# Patient Record
Sex: Female | Born: 1965 | Race: White | Hispanic: No | Marital: Married | Smoking: Never smoker
Health system: Southern US, Community
[De-identification: ages and names within clinical notes are randomized; demographics above are authoritative.]

---

## 1998-03-11 ENCOUNTER — Encounter: Admission: RE | Admit: 1998-03-11 | Discharge: 1998-06-09 | Payer: Self-pay | Admitting: Obstetrics and Gynecology

## 1998-05-19 ENCOUNTER — Inpatient Hospital Stay (HOSPITAL_COMMUNITY): Admission: AD | Admit: 1998-05-19 | Discharge: 1998-05-19 | Payer: Self-pay | Admitting: Obstetrics & Gynecology

## 1998-05-21 ENCOUNTER — Inpatient Hospital Stay (HOSPITAL_COMMUNITY): Admission: AD | Admit: 1998-05-21 | Discharge: 1998-05-24 | Payer: Self-pay | Admitting: Obstetrics and Gynecology

## 1998-05-24 ENCOUNTER — Encounter (HOSPITAL_COMMUNITY): Admission: RE | Admit: 1998-05-24 | Discharge: 1998-08-22 | Payer: Self-pay | Admitting: Obstetrics and Gynecology

## 1998-08-19 ENCOUNTER — Encounter (HOSPITAL_COMMUNITY): Admission: RE | Admit: 1998-08-19 | Discharge: 1998-11-17 | Payer: Self-pay | Admitting: *Deleted

## 2000-05-10 ENCOUNTER — Other Ambulatory Visit: Admission: RE | Admit: 2000-05-10 | Discharge: 2000-05-10 | Payer: Self-pay | Admitting: Obstetrics and Gynecology

## 2000-05-31 ENCOUNTER — Inpatient Hospital Stay (HOSPITAL_COMMUNITY): Admission: RE | Admit: 2000-05-31 | Discharge: 2000-06-02 | Payer: Self-pay | Admitting: Obstetrics and Gynecology

## 2003-05-03 ENCOUNTER — Encounter: Admission: RE | Admit: 2003-05-03 | Discharge: 2003-05-03 | Payer: Self-pay | Admitting: Allergy and Immunology

## 2003-07-30 ENCOUNTER — Other Ambulatory Visit: Admission: RE | Admit: 2003-07-30 | Discharge: 2003-07-30 | Payer: Self-pay | Admitting: Obstetrics and Gynecology

## 2003-10-22 ENCOUNTER — Encounter: Admission: RE | Admit: 2003-10-22 | Discharge: 2003-10-22 | Payer: Self-pay | Admitting: Obstetrics and Gynecology

## 2005-01-20 IMAGING — CT CT PARANASAL SINUSES LIMITED
1 series · 16 of 24 positions shown, 20 images · non-contrast
Comparison: none

CLINICAL DATA: Chronic cough, sinus drainage, nose bleeds, allergies.
 LIMITED PARANASAL SINUS CT ? NO CONTRAST ? 05/03/03

[Series 2: — · axial · 0.33mm/px · z∈[+0,+85]mm · 16 of 24 slices shown, 20 images]
[im 2/24  brain]
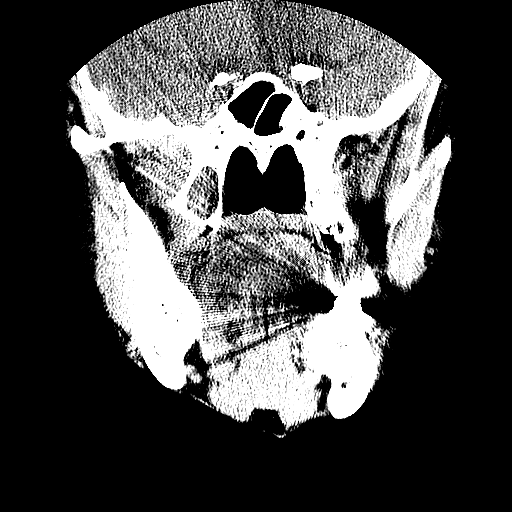
[im 2/24  bone]
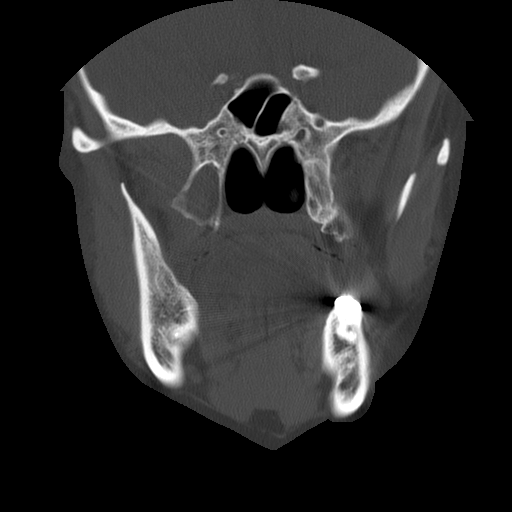
[im 4/24  bone]
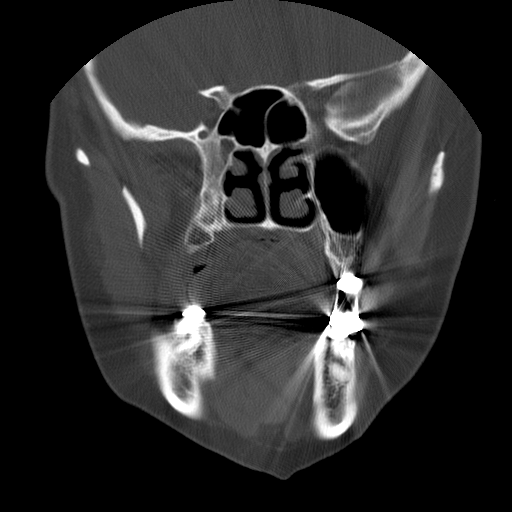
[im 5/24  bone]
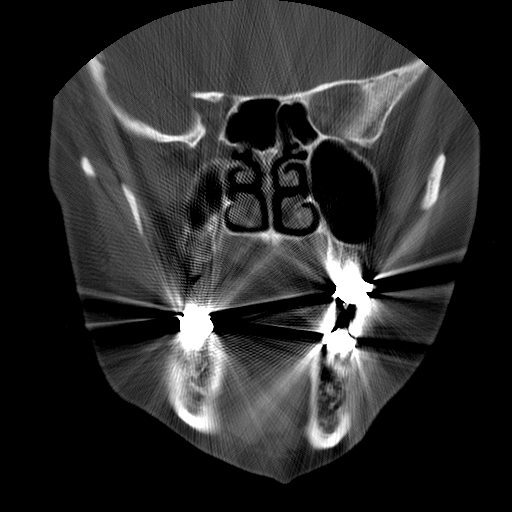
[im 6/24  bone]
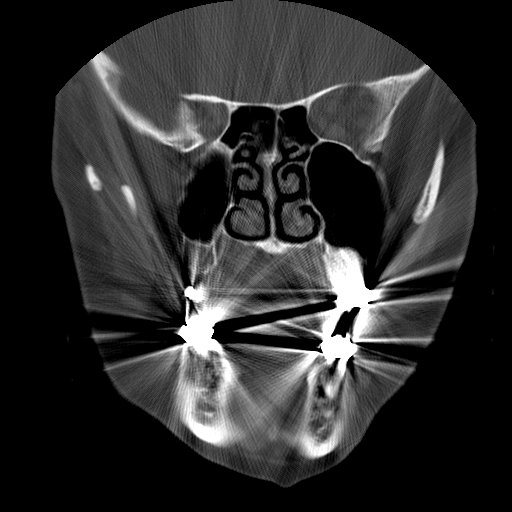
[im 8/24  brain]
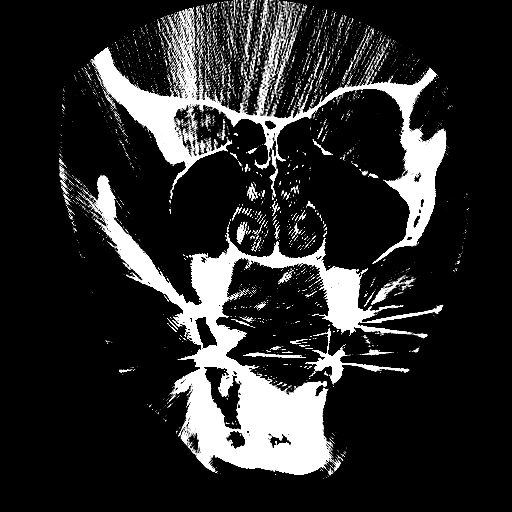
[im 8/24  bone]
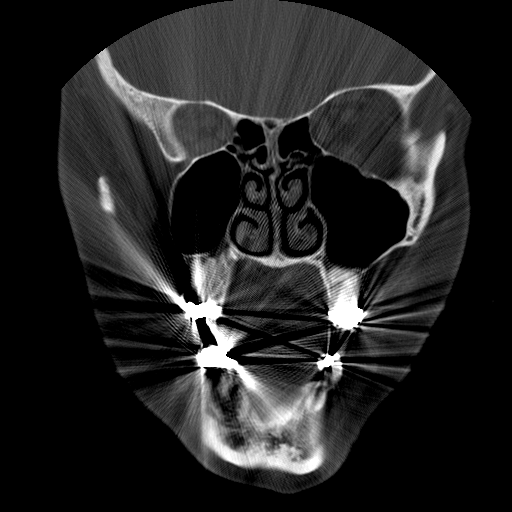
[im 9/24  bone]
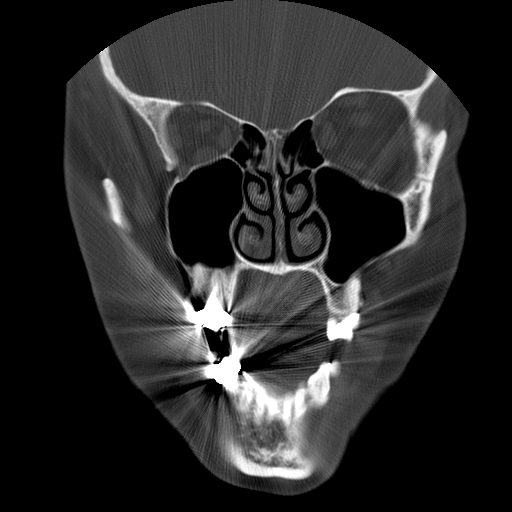
[im 10/24  bone]
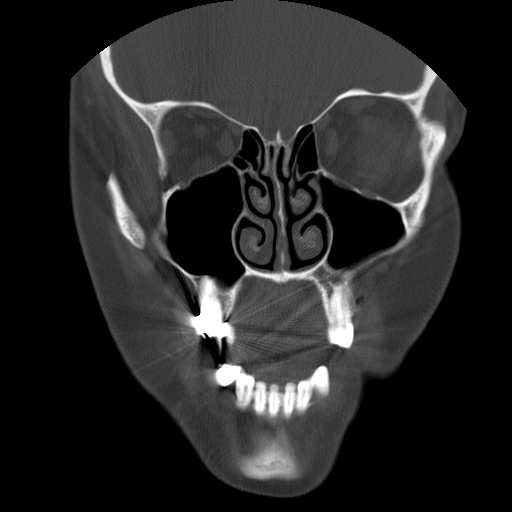
[im 12/24  bone]
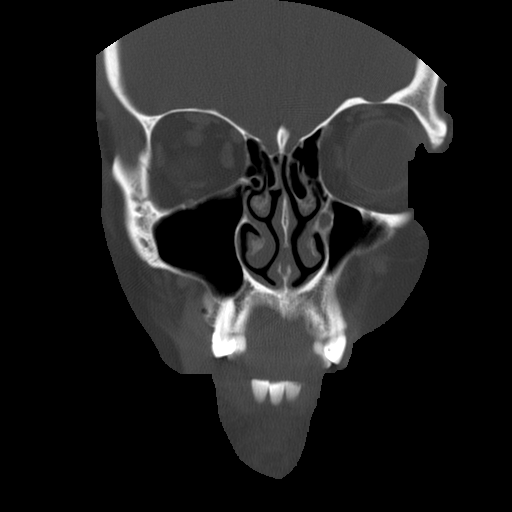
[im 13/24  brain]
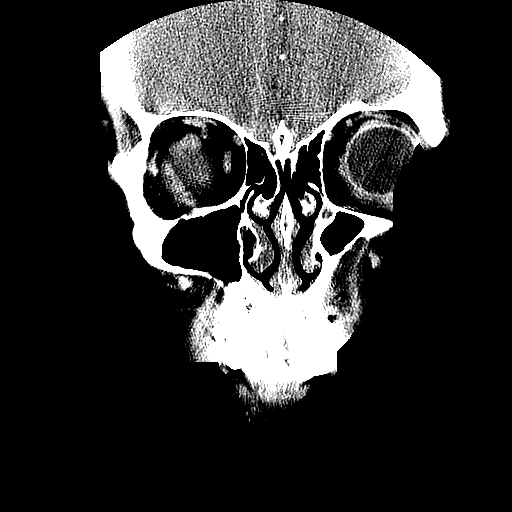
[im 13/24  bone]
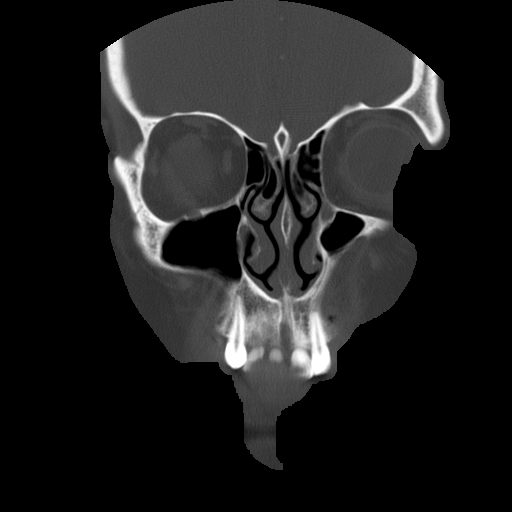
[im 15/24  bone]
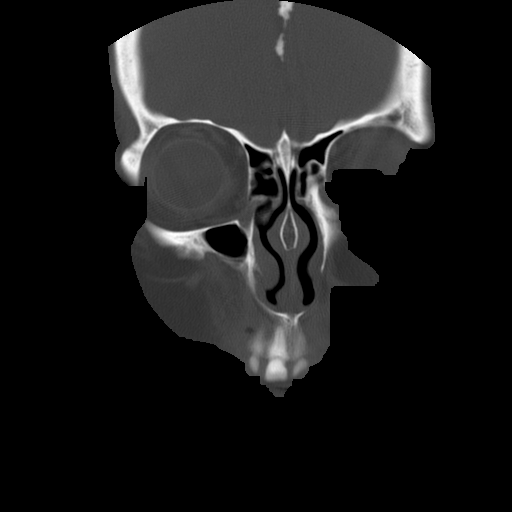
[im 16/24  bone]
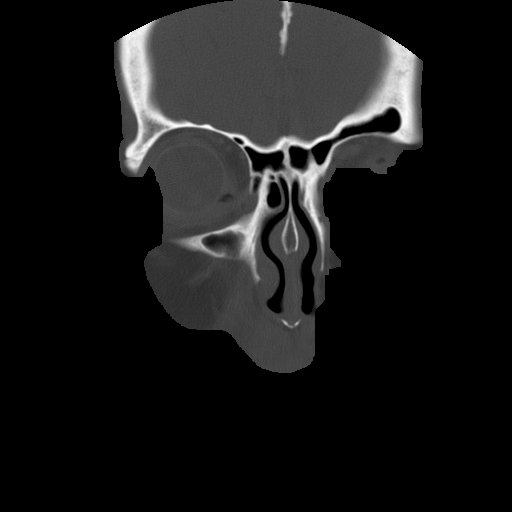
[im 17/24  bone]
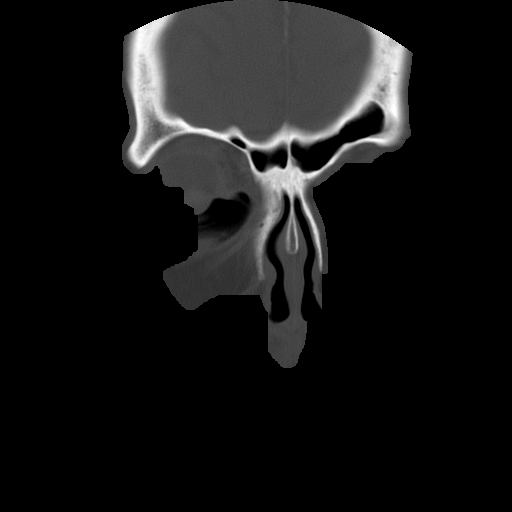
[im 19/24  brain]
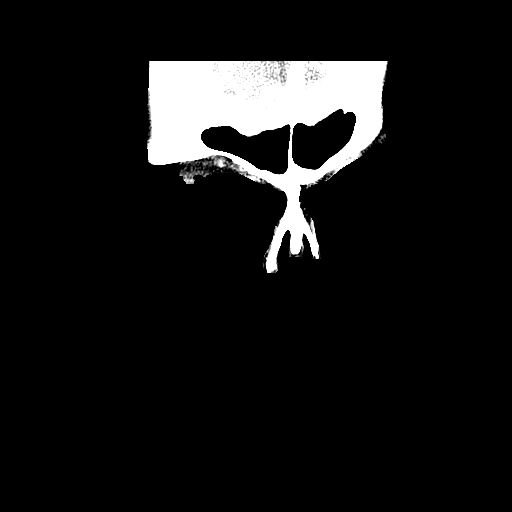
[im 19/24  bone]
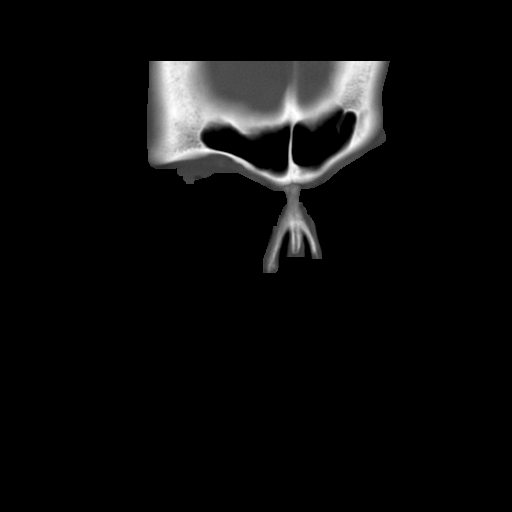
[im 20/24  bone]
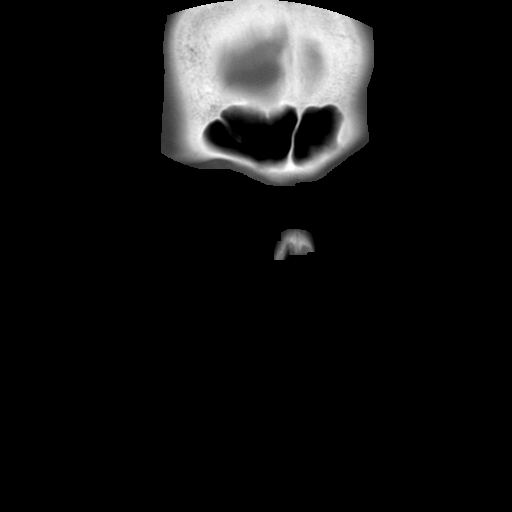
[im 21/24  bone]
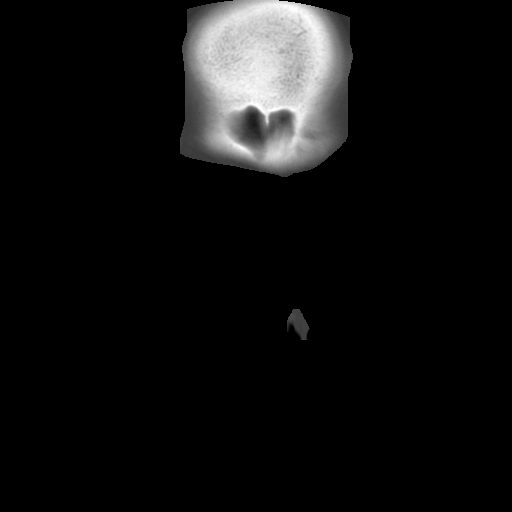
[im 23/24  bone]
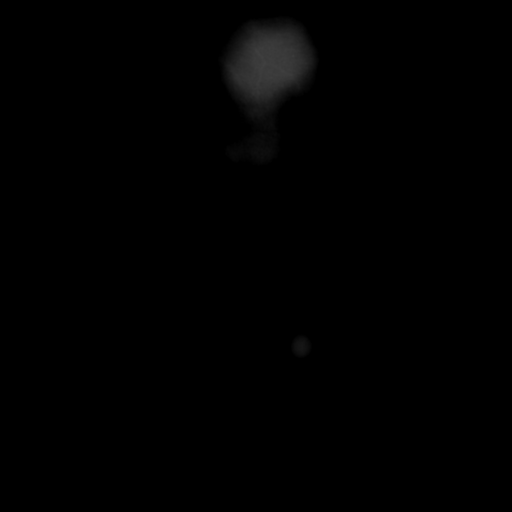

[16 of 24 positions shown; findings below may reference images not displayed]

FINDINGS: Selected coronal images were obtained through the major paranasal sinuses with no IV contrast.
 The paranasal sinuses are clear.  A small right concha bullosa, anatomic variant, is seen with no other significant abnormality noted.
 IMPRESSION
 1.  Small right concha bullosa.
 2.  Otherwise negative.
 CHEST -  2 VIEWS   - 05/03/03
FINDINGS: The heart size and mediastinal contours are normal.  The lungs are clear.  The visualized skeleton is unremarkable. 
 IMPRESSION
 No active disease.

## 2005-05-07 ENCOUNTER — Ambulatory Visit: Payer: Self-pay | Admitting: Family Medicine

## 2005-06-08 ENCOUNTER — Ambulatory Visit: Payer: Self-pay | Admitting: Family Medicine

## 2005-12-17 ENCOUNTER — Ambulatory Visit: Payer: Self-pay | Admitting: Family Medicine

## 2005-12-22 ENCOUNTER — Ambulatory Visit: Payer: Self-pay | Admitting: Family Medicine

## 2006-01-14 ENCOUNTER — Encounter: Admission: RE | Admit: 2006-01-14 | Discharge: 2006-01-14 | Payer: Self-pay | Admitting: Family Medicine

## 2011-08-11 DIAGNOSIS — Z9109 Other allergy status, other than to drugs and biological substances: Secondary | ICD-10-CM | POA: Insufficient documentation

## 2013-04-01 DIAGNOSIS — R059 Cough, unspecified: Secondary | ICD-10-CM | POA: Insufficient documentation

## 2014-07-29 ENCOUNTER — Other Ambulatory Visit: Payer: Self-pay | Admitting: Family Medicine

## 2014-07-29 DIAGNOSIS — R1011 Right upper quadrant pain: Secondary | ICD-10-CM

## 2014-07-29 DIAGNOSIS — R197 Diarrhea, unspecified: Secondary | ICD-10-CM

## 2014-07-30 ENCOUNTER — Other Ambulatory Visit: Payer: Self-pay

## 2014-11-05 ENCOUNTER — Other Ambulatory Visit: Payer: Self-pay | Admitting: Obstetrics & Gynecology

## 2014-11-05 DIAGNOSIS — R599 Enlarged lymph nodes, unspecified: Secondary | ICD-10-CM

## 2014-11-07 ENCOUNTER — Other Ambulatory Visit: Payer: Self-pay

## 2014-11-27 ENCOUNTER — Ambulatory Visit
Admission: RE | Admit: 2014-11-27 | Discharge: 2014-11-27 | Disposition: A | Discharge status: Home / Self Care | Payer: Managed Care, Other (non HMO) | Source: Ambulatory Visit | Attending: Obstetrics & Gynecology | Admitting: Obstetrics & Gynecology

## 2014-11-27 DIAGNOSIS — R599 Enlarged lymph nodes, unspecified: Secondary | ICD-10-CM

## 2015-01-23 ENCOUNTER — Encounter: Payer: Self-pay | Admitting: Obstetrics & Gynecology

## 2015-02-06 ENCOUNTER — Encounter: Payer: Self-pay | Admitting: Obstetrics & Gynecology

## 2015-12-08 ENCOUNTER — Other Ambulatory Visit: Payer: Self-pay | Admitting: Physician Assistant

## 2015-12-08 DIAGNOSIS — K769 Liver disease, unspecified: Secondary | ICD-10-CM

## 2015-12-26 ENCOUNTER — Ambulatory Visit
Admission: RE | Admit: 2015-12-26 | Discharge: 2015-12-26 | Disposition: A | Discharge status: Home / Self Care | Payer: Managed Care, Other (non HMO) | Source: Ambulatory Visit | Attending: Physician Assistant | Admitting: Physician Assistant

## 2015-12-26 DIAGNOSIS — K769 Liver disease, unspecified: Secondary | ICD-10-CM

## 2017-10-10 ENCOUNTER — Ambulatory Visit
Admission: RE | Admit: 2017-10-10 | Discharge: 2017-10-10 | Disposition: A | Discharge status: Home / Self Care | Payer: Managed Care, Other (non HMO) | Source: Ambulatory Visit | Attending: Physician Assistant | Admitting: Physician Assistant

## 2017-10-10 ENCOUNTER — Other Ambulatory Visit: Payer: Self-pay | Admitting: Physician Assistant

## 2017-10-10 DIAGNOSIS — R05 Cough: Secondary | ICD-10-CM

## 2017-10-10 DIAGNOSIS — R059 Cough, unspecified: Secondary | ICD-10-CM

## 2019-06-30 IMAGING — CR DG CHEST 2V
2 series · 2 of 2 positions shown · non-contrast
Comparison: 05/01/2010 chest radiograph.

CLINICAL DATA: Cough

EXAM:
CHEST - 2 VIEW

[w chest pa]
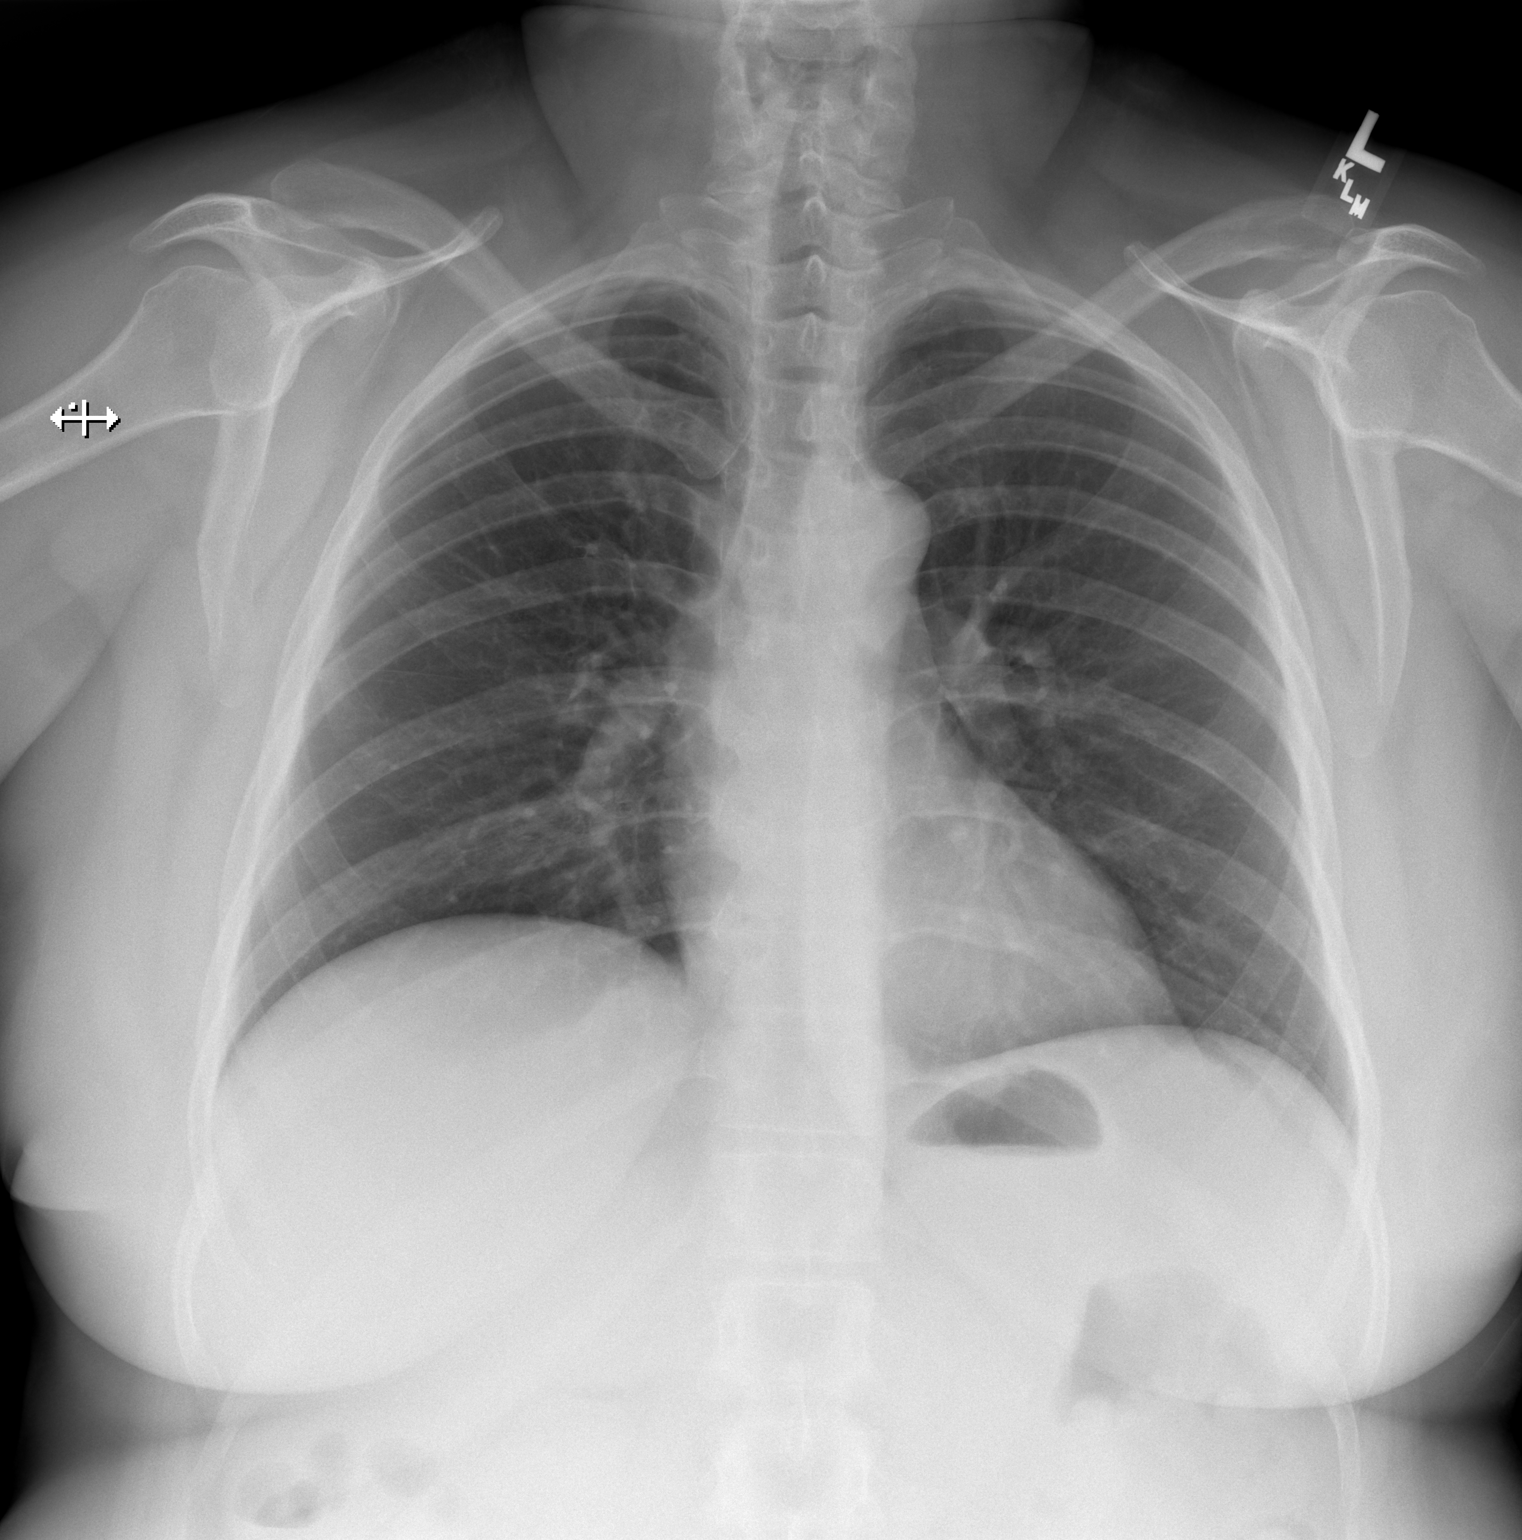

[w chest lat]
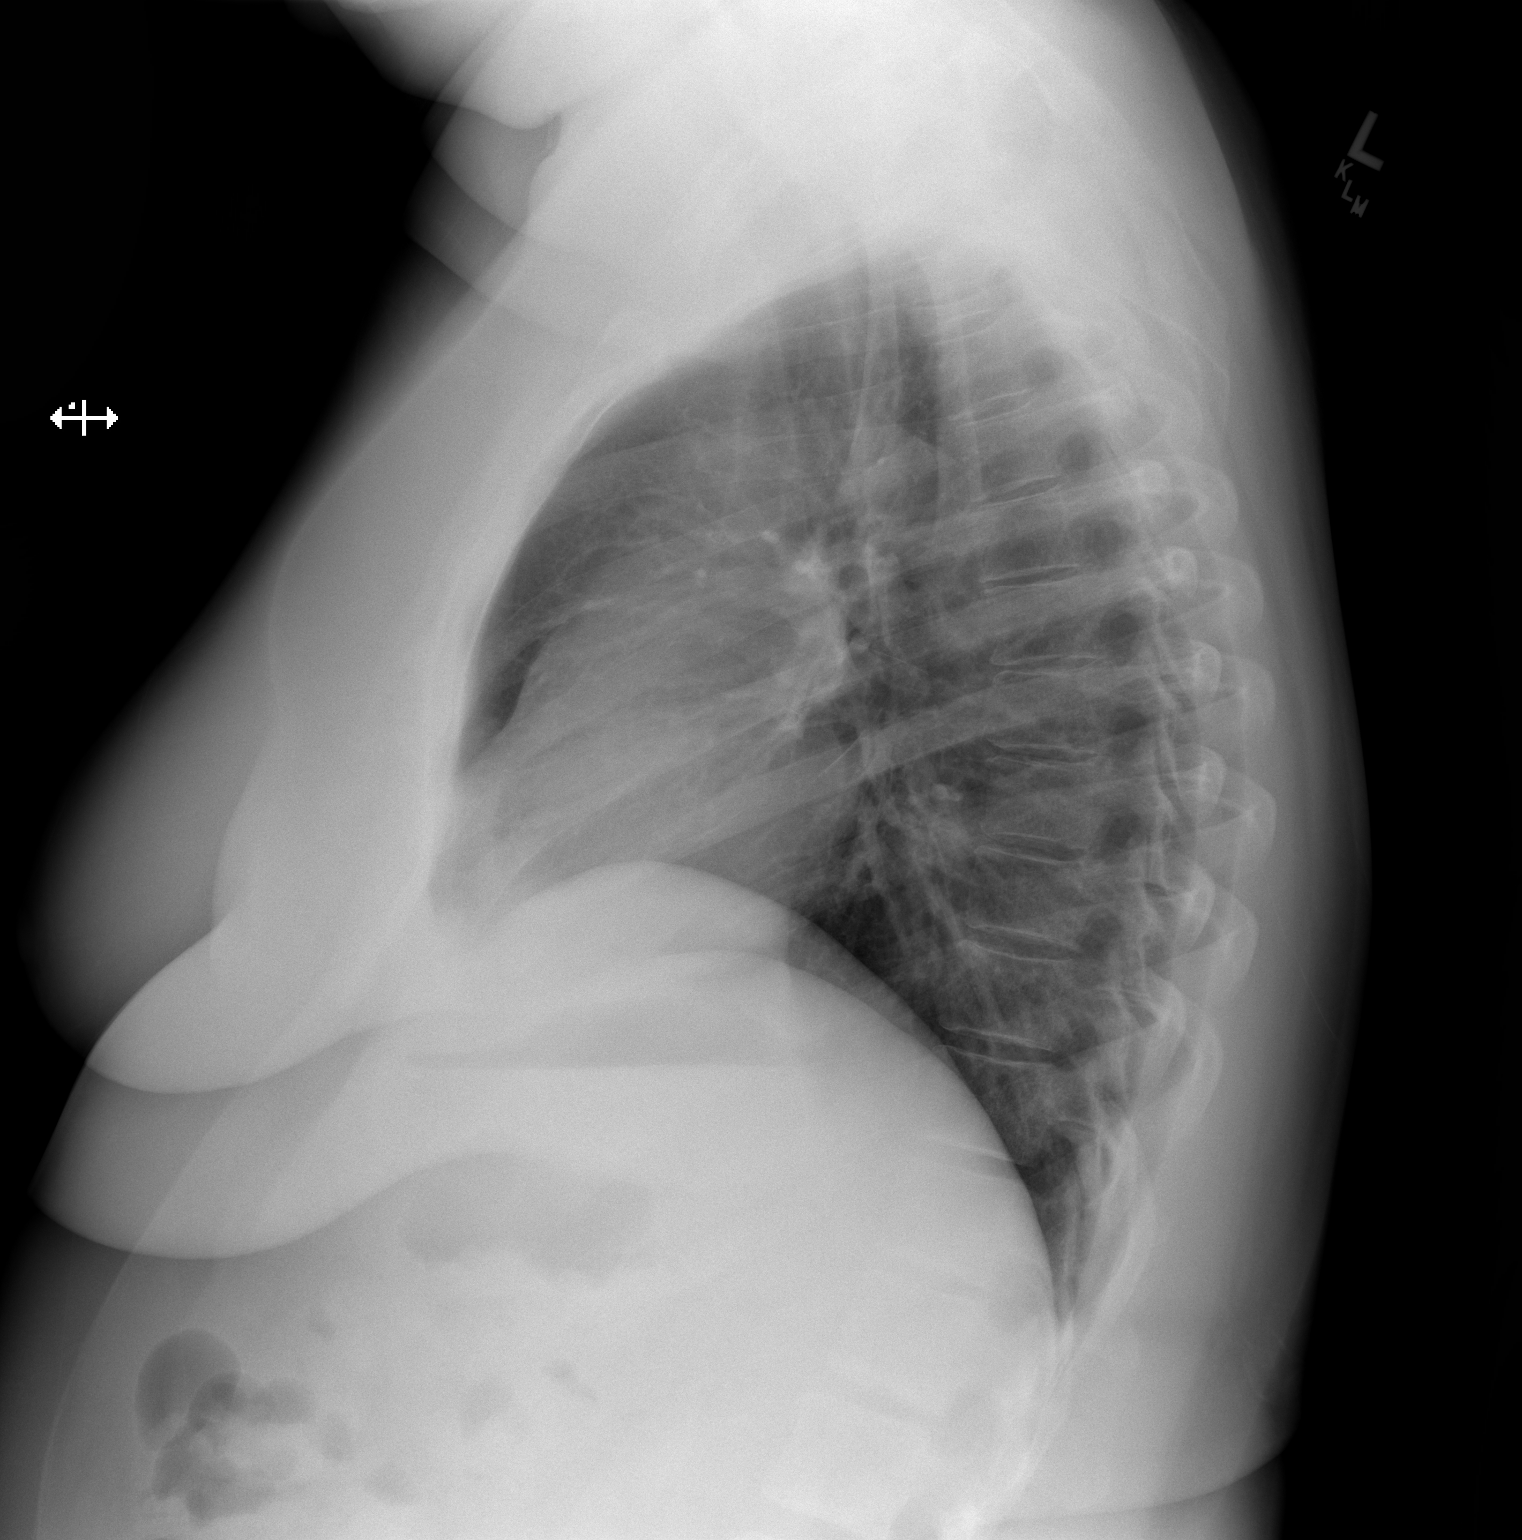

[2 of 2 positions shown; findings below may reference images not displayed]

FINDINGS: Stable cardiomediastinal silhouette with normal heart size. No
pneumothorax. No pleural effusion. Lungs appear clear, with no acute
consolidative airspace disease and no pulmonary edema.
IMPRESSION: No active cardiopulmonary disease.

## 2019-07-05 ENCOUNTER — Telehealth: Payer: Self-pay | Admitting: *Deleted

## 2019-07-05 NOTE — Telephone Encounter (Signed)
Briana Deleon returned call to office to get information on where to get Covid testing. Per Briana Deleon called back this morning and was informed where to go to sign up to be tested. Nothing further

## 2019-11-03 ENCOUNTER — Ambulatory Visit: Payer: Managed Care, Other (non HMO) | Attending: Internal Medicine

## 2019-11-03 DIAGNOSIS — Z23 Encounter for immunization: Secondary | ICD-10-CM

## 2019-11-03 NOTE — Progress Notes (Signed)
   Covid-19 Vaccination Clinic  Name:  LAKINA MCINTIRE    MRN: [id number] DOB: 11-15-1965  11/03/2019  Ms. Windle was observed post Covid-19 immunization for 15 minutes without incident. She was provided with Vaccine Information Sheet and instruction to access the V-Safe system.   Ms. Crosby was instructed to call 911 with any severe reactions post vaccine: Marland Kitchen Difficulty breathing  . Swelling of face and throat  . A fast heartbeat  . A bad rash all over body  . Dizziness and weakness   Immunizations Administered    Name Date Dose VIS Date Route   Pfizer COVID-19 Vaccine 11/03/2019 10:30 AM 0.3 mL 06/06/2018 Intramuscular   Manufacturer: [employer], [employer]   Lot: [id number]   NDC: [address]-1

## 2019-11-27 ENCOUNTER — Ambulatory Visit: Payer: Managed Care, Other (non HMO) | Attending: Internal Medicine

## 2019-11-27 DIAGNOSIS — Z23 Encounter for immunization: Secondary | ICD-10-CM

## 2019-11-27 NOTE — Progress Notes (Signed)
   Covid-19 Vaccination Clinic  Name:  REHA MARTINOVICH    MRN: [id number] DOB: February 19, 1966  11/27/2019  Ms. Thomann was observed post Covid-19 immunization for 15 minutes without incident. She was provided with Vaccine Information Sheet and instruction to access the V-Safe system.   Ms. Michl was instructed to call 911 with any severe reactions post vaccine: Marland Kitchen Difficulty breathing  . Swelling of face and throat  . A fast heartbeat  . A bad rash all over body  . Dizziness and weakness   Immunizations Administered    Name Date Dose VIS Date Route   Pfizer COVID-19 Vaccine 11/27/2019  3:42 PM 0.3 mL 06/06/2018 Intramuscular   Manufacturer: [employer], [employer]   Lot: [id number]   NDC: [address]-5

## 2020-03-03 ENCOUNTER — Other Ambulatory Visit: Payer: Self-pay | Admitting: Physician Assistant

## 2020-03-03 DIAGNOSIS — R1013 Epigastric pain: Secondary | ICD-10-CM

## 2020-03-03 DIAGNOSIS — R748 Abnormal levels of other serum enzymes: Secondary | ICD-10-CM

## 2020-03-18 ENCOUNTER — Ambulatory Visit
Admission: RE | Admit: 2020-03-18 | Discharge: 2020-03-18 | Disposition: A | Discharge status: Home / Self Care | Payer: Managed Care, Other (non HMO) | Source: Ambulatory Visit | Attending: Physician Assistant | Admitting: Physician Assistant

## 2020-03-18 DIAGNOSIS — R748 Abnormal levels of other serum enzymes: Secondary | ICD-10-CM

## 2020-03-18 DIAGNOSIS — R1013 Epigastric pain: Secondary | ICD-10-CM

## 2021-03-26 ENCOUNTER — Other Ambulatory Visit: Payer: Self-pay | Admitting: Physician Assistant

## 2021-03-26 DIAGNOSIS — R600 Localized edema: Secondary | ICD-10-CM

## 2021-04-07 ENCOUNTER — Ambulatory Visit
Admission: RE | Admit: 2021-04-07 | Discharge: 2021-04-07 | Disposition: A | Discharge status: Home / Self Care | Payer: Managed Care, Other (non HMO) | Source: Ambulatory Visit | Attending: Physician Assistant | Admitting: Physician Assistant

## 2021-04-07 DIAGNOSIS — R600 Localized edema: Secondary | ICD-10-CM

## 2021-05-21 ENCOUNTER — Other Ambulatory Visit: Payer: Self-pay

## 2021-05-21 DIAGNOSIS — M7989 Other specified soft tissue disorders: Secondary | ICD-10-CM

## 2021-05-28 ENCOUNTER — Other Ambulatory Visit: Payer: Self-pay

## 2021-05-28 ENCOUNTER — Ambulatory Visit: Payer: Managed Care, Other (non HMO) | Admitting: Physician Assistant

## 2021-05-28 ENCOUNTER — Ambulatory Visit (HOSPITAL_COMMUNITY)
Admission: RE | Admit: 2021-05-28 | Discharge: 2021-05-28 | Disposition: A | Discharge status: Home / Self Care | Payer: Managed Care, Other (non HMO) | Source: Ambulatory Visit | Attending: Surgery | Admitting: Surgery

## 2021-05-28 VITALS — BP 128/83 | HR 75 | Temp 97.5°F | Resp 20 | Ht 68.0 in | Wt 245.8 lb

## 2021-05-28 DIAGNOSIS — R7303 Prediabetes: Secondary | ICD-10-CM | POA: Insufficient documentation

## 2021-05-28 DIAGNOSIS — E785 Hyperlipidemia, unspecified: Secondary | ICD-10-CM | POA: Insufficient documentation

## 2021-05-28 DIAGNOSIS — B9689 Other specified bacterial agents as the cause of diseases classified elsewhere: Secondary | ICD-10-CM | POA: Insufficient documentation

## 2021-05-28 DIAGNOSIS — M7989 Other specified soft tissue disorders: Secondary | ICD-10-CM | POA: Diagnosis not present

## 2021-05-28 DIAGNOSIS — I10 Essential (primary) hypertension: Secondary | ICD-10-CM | POA: Insufficient documentation

## 2021-05-28 DIAGNOSIS — R590 Localized enlarged lymph nodes: Secondary | ICD-10-CM | POA: Insufficient documentation

## 2021-05-28 DIAGNOSIS — O24419 Gestational diabetes mellitus in pregnancy, unspecified control: Secondary | ICD-10-CM | POA: Insufficient documentation

## 2021-05-28 DIAGNOSIS — N760 Acute vaginitis: Secondary | ICD-10-CM | POA: Insufficient documentation

## 2021-05-28 DIAGNOSIS — R609 Edema, unspecified: Secondary | ICD-10-CM | POA: Insufficient documentation

## 2021-05-28 NOTE — Progress Notes (Signed)
[facility]           [facility] [place]  History and Physical   Briana Deleon is a 56 y.o. female who presents with left leg swelling.  She states that in [place] went [place] and did not [place] compression and by the time she got there, she had pain and a hard time walking.  She has had swelling in the left leg since then.  She states that she also get swelling in both ankles as well.  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  She states that if she sits for long periods of time, the swelling gets worse.  She does have some aching in the legs.  She denies any claudication but does get occasional cramps at night.  She does not have hx of DVT.  Her brother is currently dealing with ulcers on his legs and sounds venous in nature.  She thinks her grandmother may have had varicose vein/leg swelling issues.  She has worn knee high compression but it becomes uncomfortable at the top of the sock.     The pt is not on a statin for cholesterol management.  The pt is not on a daily aspirin.   Other AC:  none The pt is on ACEI for hypertension.   The pt is not diabetic.   Tobacco hx:  never   PMH: HTN Gestational DM HLD  She does not have any family hx of AAA  Social History   Socioeconomic History   Marital status: Married    Spouse name: Not on file   Number of children: Not on file   Years of education: Not on file   Highest education level: Not on file  Occupational History   Not on file  Tobacco Use   Smoking status: Never    Passive exposure: Past   Smokeless tobacco: Never  Substance and Sexual Activity   Alcohol use: Never   Drug use: Never   Sexual activity: Not Currently  Other Topics Concern   Not on file  Social History Narrative   Not on file   Social Determinants of Health   Financial Resource Strain: Not on file  Food  Insecurity: Not on file  Transportation Needs: Not on file  Physical Activity: Not on file  Stress: Not on file  Social Connections: Not on file  Intimate Partner Violence: Not on file    FH: negative for AAA; +for varicose veins (brother)  Current Outpatient Medications  Medication Sig Dispense Refill   lisinopril (ZESTRIL) 20 MG tablet 1 tablet     No current facility-administered medications for this visit.      REVIEW OF SYSTEMS:   [X]  denotes positive finding, [ ]  denotes negative finding Cardiac  Comments:  Chest pain or chest pressure:    Shortness of breath upon exertion:    Short of breath when lying flat:    Irregular heart rhythm:        Vascular    Pain in calf, thigh, or hip brought on by ambulation:    Pain in feet at night that wakes you up from your sleep:     Blood clot in your veins:    Leg swelling:  x       Pulmonary    Oxygen at  home:    Productive cough:     Wheezing:         Neurologic    Sudden weakness in arms or legs:     Sudden numbness in arms or legs:     Sudden onset of difficulty speaking or slurred speech:    Temporary loss of vision in one eye:     Problems with dizziness:         Gastrointestinal    Blood in stool:     Vomited blood:         Genitourinary    Burning when urinating:     Blood in urine:        Psychiatric    Major depression:         Hematologic    Bleeding problems:    Problems with blood clotting too easily:        Skin    Rashes or ulcers:        Constitutional    Fever or chills:      PHYSICAL EXAMINATION:  Today's Vitals   05/28/21 1047  BP: 128/83  Pulse: 75  Resp: 20  Temp: (!) 97.5 F (36.4 C)  TempSrc: Temporal  SpO2: 98%  Weight: 245 lb 12.8 oz (111.5 kg)  Height: 5\' 8"  (1.727 m)  PainSc: 5   PainLoc: Leg   Body mass index is 37.37 kg/m.   General:  WDWN in NAD; vital signs documented above Gait: Not observed HENT: WNL, normocephalic Pulmonary: normal non-labored  breathing without wheezing Cardiac: regular HR; without carotid bruits Abdomen: soft, NT, no masses; aortic pulse is not palpable Skin: without rashes Vascular Exam/Pulses:  Right Left  Radial 2+ (normal) 2+ (normal)  DP 1+ (weak) 1+ (weak)   Extremities: + large varicosity LLE   Neurologic: A&O X 3;  moving all extremities equally Psychiatric:  The pt has Normal affect.   Non-Invasive Vascular Imaging:   Venous duplex on 05/28/2021: +------------------+---------+------+-----------+------------+-------------  ----+   LEFT               Reflux No Reflux Reflux Time Diameter cms Comments                                     Yes                                          +------------------+---------+------+-----------+------------+-------------   CFV                           yes    >1 second                             +------------------+---------+------+-----------+------------+-------------   FV prox            no                                                      +------------------+---------+------+-----------+------------+-------------   FV mid             no                                                      +------------------+---------+------+-----------+------------+-------------  FV dist            no                                                      +------------------+---------+------+-----------+------------+-------------   Popliteal          no                                                      +------------------+---------+------+-----------+------------+-------------   GSV at SFJ                    yes     >500 ms       1.06                   +------------------+---------+------+-----------+------------+-------------    GSV prox thigh                yes     >500 ms      0.648                   +------------------+---------+------+-----------+------------+-------------   GSV mid thigh                 yes     >500 ms       0.65     large branch out     +------------------+---------+------+-----------+------------+-------------   GSV dist thigh     no                              0.292                   +------------------+---------+------+-----------+------------+-------------   GSV at knee        no                              0.255                   +------------------+---------+------+-----------+------------+-------------   GSV prox calf      no                              0.342                   +------------------+---------+------+-----------+------------+-------------    SSV Pop Fossa      no                              0.316                   +------------------+---------+------+-----------+------------+-------------   SSV prox calf      no                              0.304                   +------------------+---------+------+-----------+------------+-------------   SSV mid calf  no                              0.293                   +------------------+---------+------+-----------+------------+-------------   GSV branch OOF mid            yes     >500 ms      0.514      thigh                                     +------------------+---------+------+-----------+------------+-------------   GSV branch distal  no                              0.514               thigh                +------------------+---------+------+-----------+------------+-------------     Summary:  Left:  - No evidence of deep vein thrombosis seen in the left lower extremity,  from the common femoral through the popliteal veins.  - No evidence of superficial venous thrombosis in the left lower extremity.  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    Briana Deleon is a 56 y.o. female who presents with: left leg swelling  -pt has 1+ DP pedal pulses -pt does not have evidence of DVT.  Pt does have venous reflux in the CFV as well as the GSV at the SFJ, GSV  in the proximal and mid thigh as well as a branch out of the fascia and measures greater than 26mm. -discussed with pt about wearing thigh high 20-30 mmHg compression stockings and pt was measured for these today.   Discussed about putting these on in the morning before getting out of bed and taking them off at night -discussed the importance of leg elevation and how to elevate properly - pt is advised to elevate their legs and a diagram is given to them to demonstrate for pt to lay flat on their back with knees elevated and slightly bent with their feet higher than their knees, which puts their feet higher than their heart for 15 minutes per day.  If pt cannot lay flat, advised to lay as flat as possible.  -pt is advised to continue as much walking as possible and avoid sitting or standing for long periods of time. She does a lot of sitting at work-discussed walking around every hour.  -discussed importance of weight loss and exercise and that water aerobics would also be beneficial.  -handout with recommendations given -pt will f/u 3 months for evaluation for laser ablation.     Doreatha Massed, [facility] 05/28/2021 11:17 AM  Clinic MD:  Myra Gianotti

## 2021-08-26 ENCOUNTER — Ambulatory Visit: Payer: Managed Care, Other (non HMO) | Admitting: Vascular Surgery

## 2021-10-26 DIAGNOSIS — R7303 Prediabetes: Secondary | ICD-10-CM | POA: Diagnosis not present

## 2021-10-26 DIAGNOSIS — I10 Essential (primary) hypertension: Secondary | ICD-10-CM | POA: Diagnosis not present

## 2021-10-26 DIAGNOSIS — E785 Hyperlipidemia, unspecified: Secondary | ICD-10-CM | POA: Diagnosis not present

## 2021-10-29 DIAGNOSIS — N898 Other specified noninflammatory disorders of vagina: Secondary | ICD-10-CM | POA: Diagnosis not present

## 2021-10-29 DIAGNOSIS — Z01419 Encounter for gynecological examination (general) (routine) without abnormal findings: Secondary | ICD-10-CM | POA: Diagnosis not present

## 2021-12-06 IMAGING — US US ABDOMEN LIMITED
1 series · 14 of 25 positions shown · non-contrast
Comparison: None.

CLINICAL DATA: Elevated liver enzyme levels, epigastric pain

EXAM:
ULTRASOUND ABDOMEN LIMITED RIGHT UPPER QUADRANT

[Series 1: us abdomen limited · 0.20mm/px · 14 of 51 slices shown]
[im 1/51]
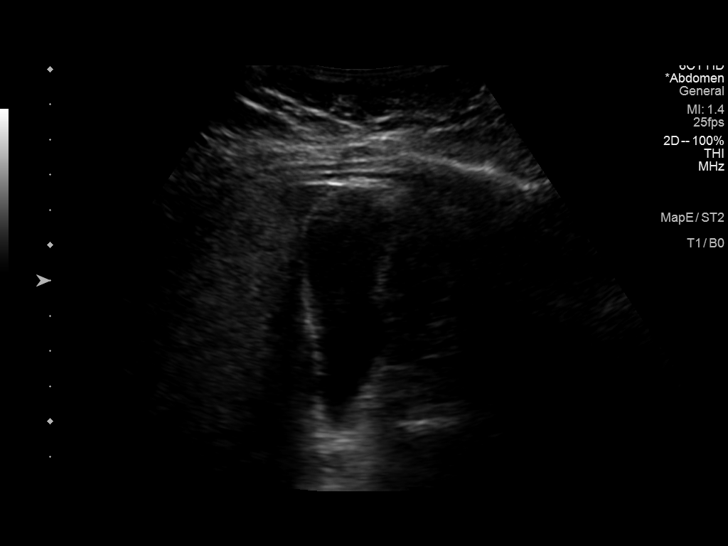
[im 5/51]
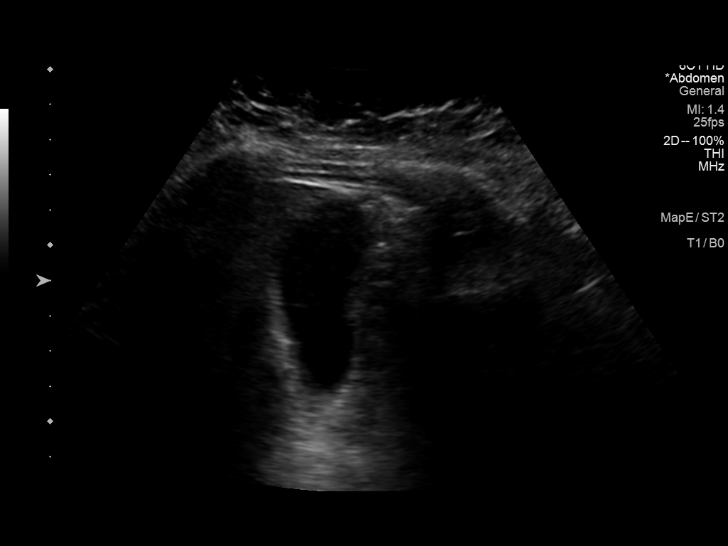
[im 9/51]
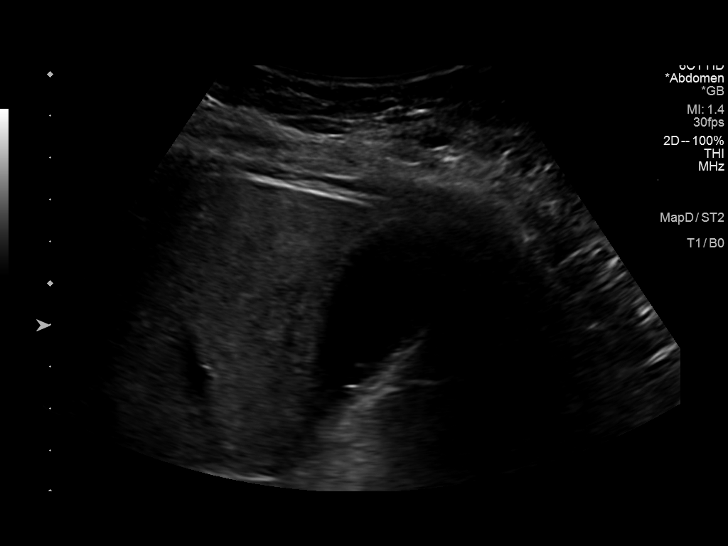
[im 13/51]
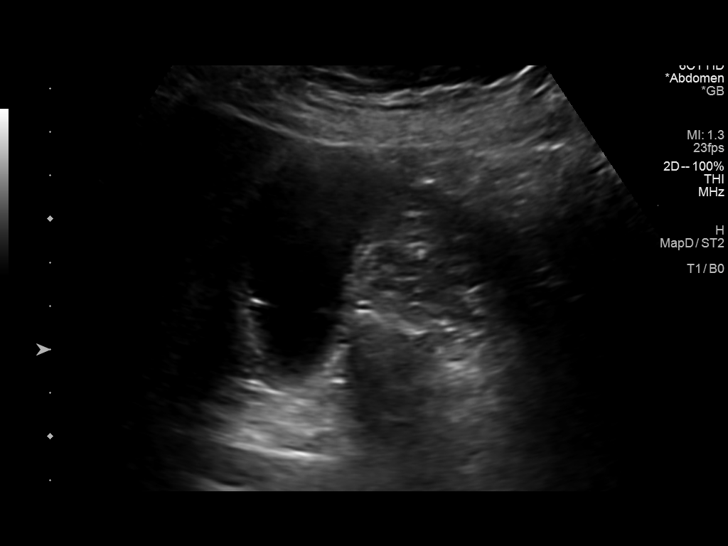
[im 17/51]
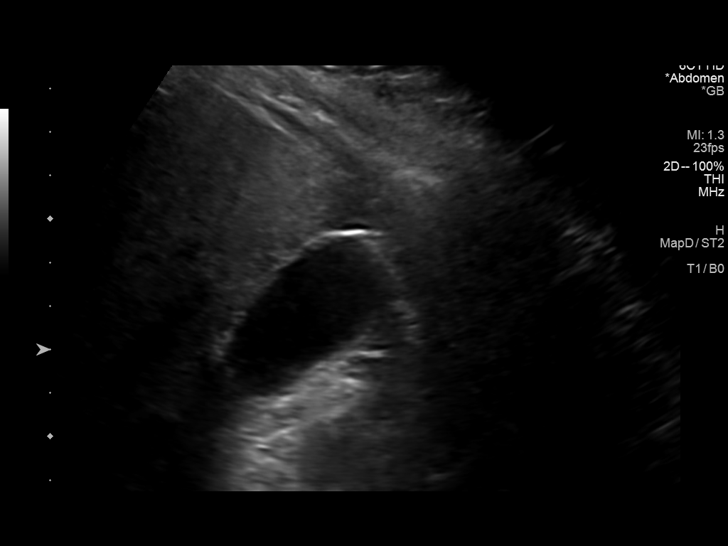
[im 19/51]
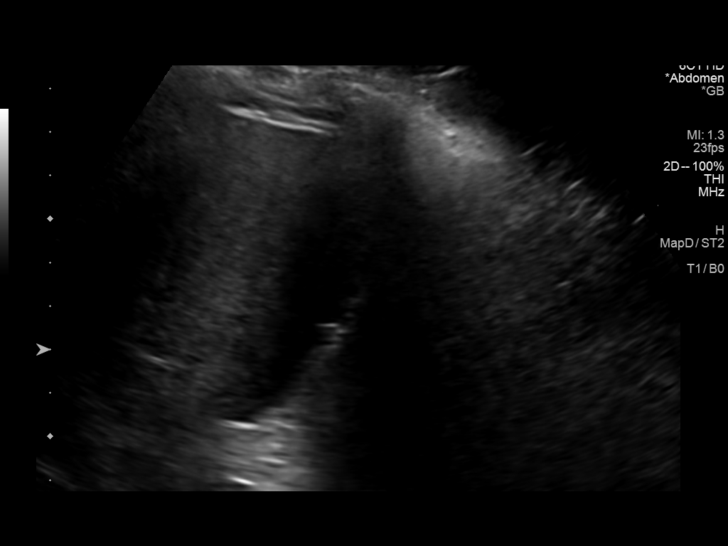
[im 23/51]
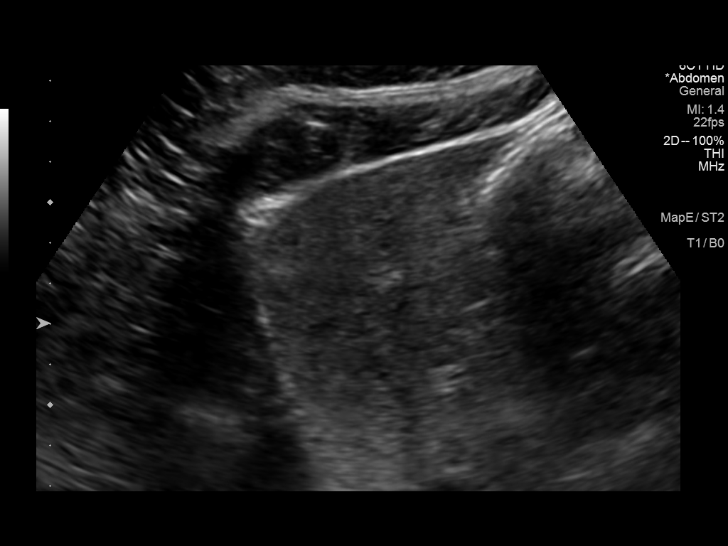
[im 28/51]
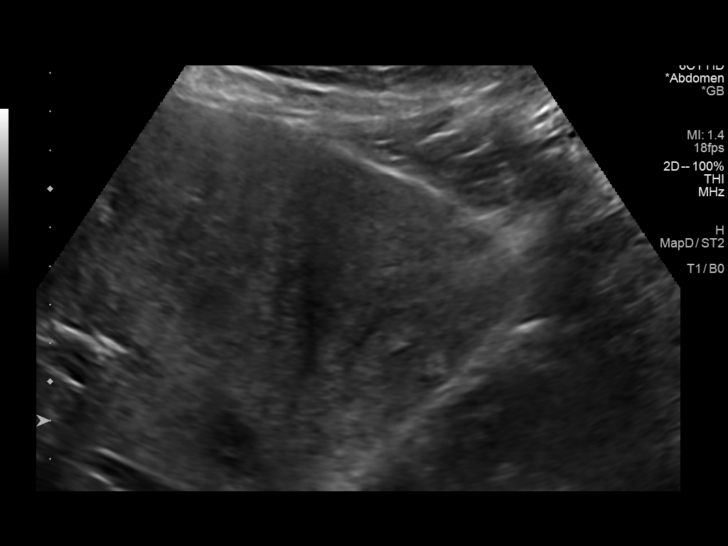
[im 32/51]
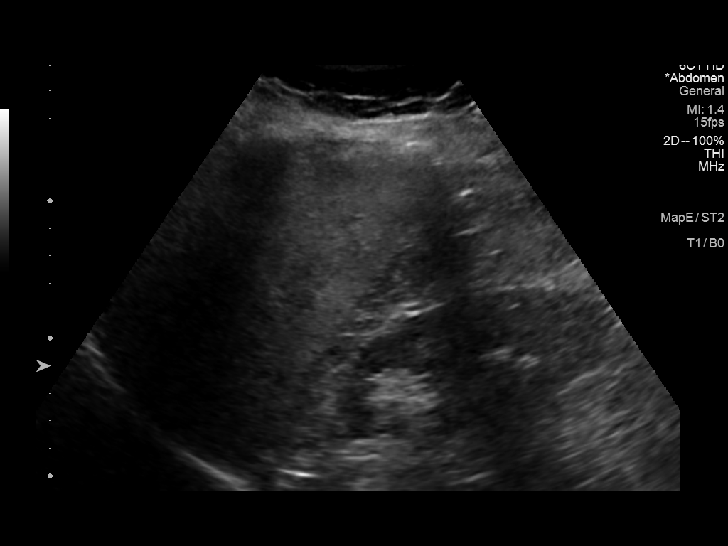
[im 34/51]
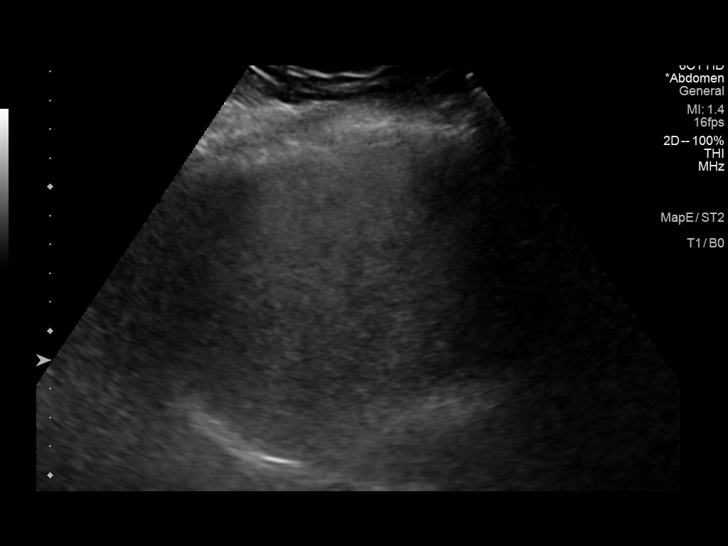
[im 38/51]
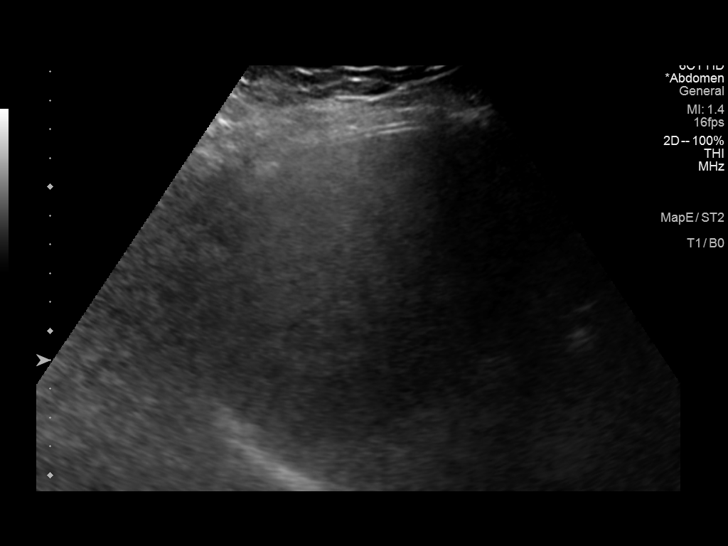
[im 42/51]
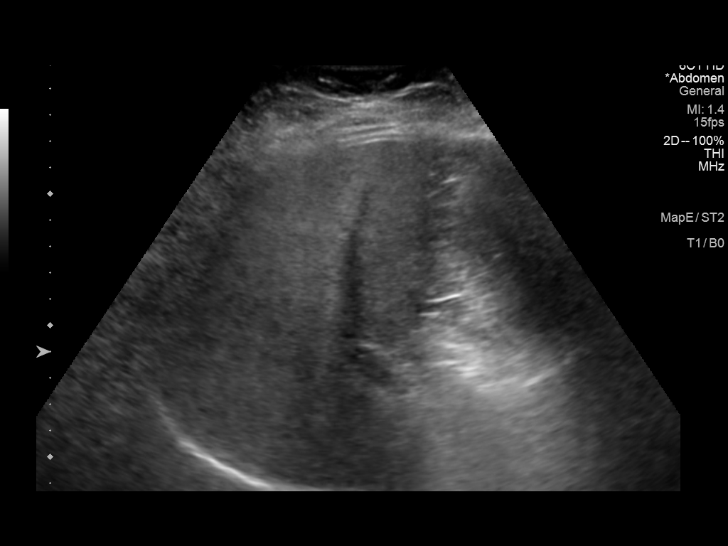
[im 46/51]
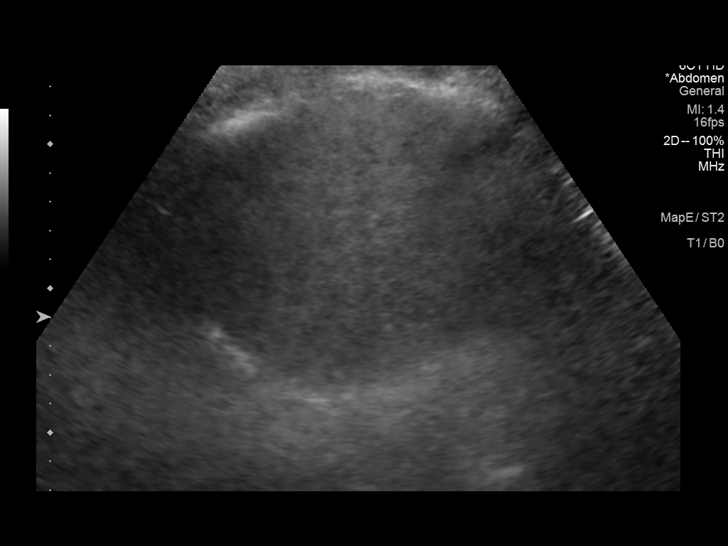
[im 51/51]
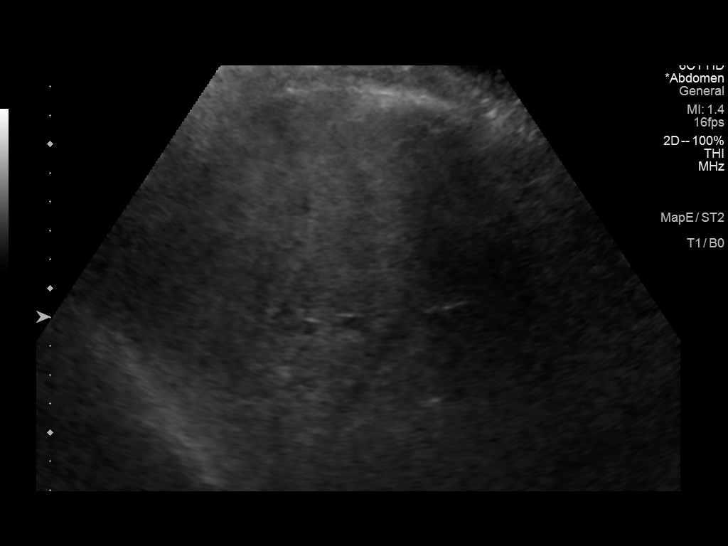

[14 of 25 positions shown; findings below may reference images not displayed]

FINDINGS: Gallbladder:

0.4 cm polyp shown on image 4. Polyps of this size do not the
require surveillance.

No significant gallbladder wall thickening. Sonographic Murphy's
sign absent. No gallstones noted.

Common bile duct:

Diameter: 0.4 cm, within normal limits.

Liver:

0.7 cm hyperechoic lesion favoring hemangioma in the left hepatic
lobe on image 30, this has been shown on prior exams such
12/26/2015. Coarse echogenic liver with poor sonic penetration
compatible with diffuse hepatic steatosis. Portal vein is patent on
color Doppler imaging with normal direction of blood flow towards
the liver.

Other: None.
IMPRESSION: 1. Coarse echogenic liver with poor sonic penetration compatible
with diffuse hepatic steatosis.
2. 0.4 cm gallbladder polyp.  This does not require surveillance.
3. Chronic hyperechoic lesion in the left hepatic lobe favoring
hemangioma.

## 2021-12-31 DIAGNOSIS — B078 Other viral warts: Secondary | ICD-10-CM | POA: Diagnosis not present

## 2021-12-31 DIAGNOSIS — L218 Other seborrheic dermatitis: Secondary | ICD-10-CM | POA: Diagnosis not present

## 2021-12-31 DIAGNOSIS — D170 Benign lipomatous neoplasm of skin and subcutaneous tissue of head, face and neck: Secondary | ICD-10-CM | POA: Diagnosis not present

## 2021-12-31 DIAGNOSIS — L281 Prurigo nodularis: Secondary | ICD-10-CM | POA: Diagnosis not present

## 2021-12-31 DIAGNOSIS — L821 Other seborrheic keratosis: Secondary | ICD-10-CM | POA: Diagnosis not present

## 2022-03-02 DIAGNOSIS — H2513 Age-related nuclear cataract, bilateral: Secondary | ICD-10-CM | POA: Diagnosis not present

## 2022-03-02 DIAGNOSIS — H52203 Unspecified astigmatism, bilateral: Secondary | ICD-10-CM | POA: Diagnosis not present

## 2022-03-02 DIAGNOSIS — H524 Presbyopia: Secondary | ICD-10-CM | POA: Diagnosis not present

## 2022-03-02 DIAGNOSIS — H1789 Other corneal scars and opacities: Secondary | ICD-10-CM | POA: Diagnosis not present

## 2022-03-02 DIAGNOSIS — H04123 Dry eye syndrome of bilateral lacrimal glands: Secondary | ICD-10-CM | POA: Diagnosis not present

## 2022-03-02 DIAGNOSIS — H5213 Myopia, bilateral: Secondary | ICD-10-CM | POA: Diagnosis not present

## 2022-06-01 DIAGNOSIS — Z0000 Encounter for general adult medical examination without abnormal findings: Secondary | ICD-10-CM | POA: Diagnosis not present

## 2022-09-25 DIAGNOSIS — R21 Rash and other nonspecific skin eruption: Secondary | ICD-10-CM | POA: Diagnosis not present

## 2022-09-25 DIAGNOSIS — S31815A Open bite of right buttock, initial encounter: Secondary | ICD-10-CM | POA: Diagnosis not present

## 2022-12-26 IMAGING — US US EXTREM LOW VENOUS*L*
1 series · 13 of 24 positions shown · non-contrast
Comparison: None.

EXAM:
LOWER EXTREMITY VENOUS DOPPLER ULTRASOUND
TECHNIQUE: Gray-scale sonography with graded compression, as well as color
Doppler and duplex ultrasound were performed to evaluate the lower
extremity deep venous systems from the level of the common femoral
vein and including the common femoral, femoral, profunda femoral,
popliteal and calf veins including the posterior tibial, peroneal
and gastrocnemius veins when visible. The superficial great
saphenous vein was also interrogated. Spectral Doppler was utilized
to evaluate flow at rest and with distal augmentation maneuvers in
the common femoral, femoral and popliteal veins.

[Series 1: us extrem low venous*left* · 0.08mm/px · 13 of 36 slices shown]
[im 1/36]
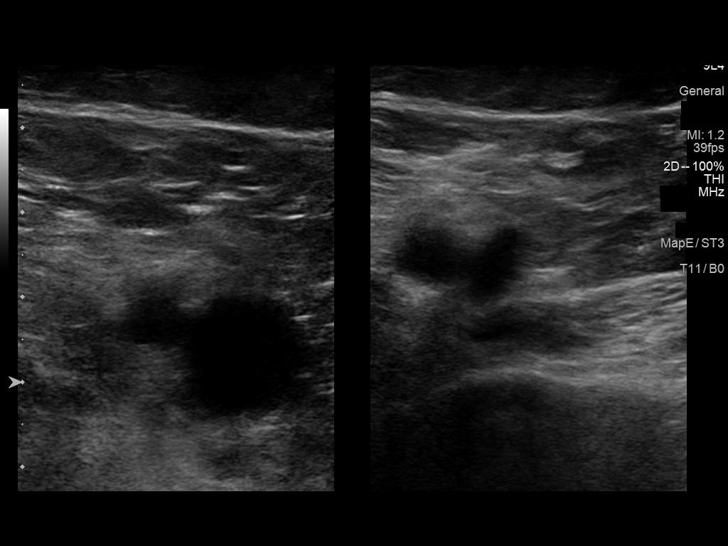
[im 4/36]
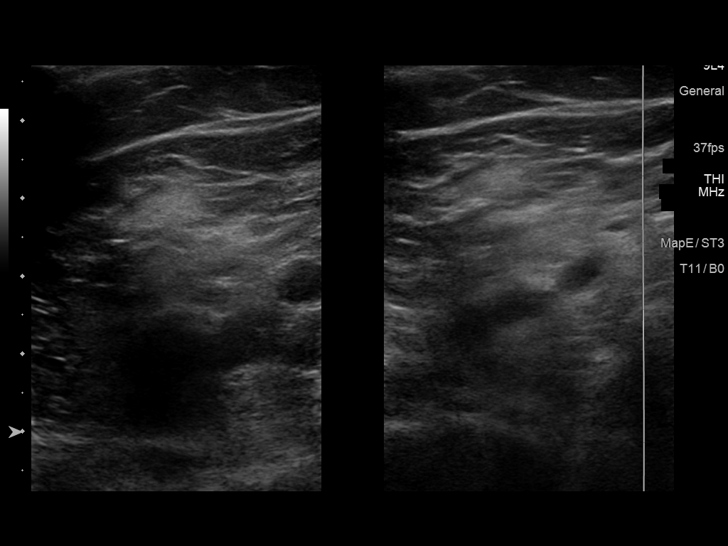
[im 7/36]
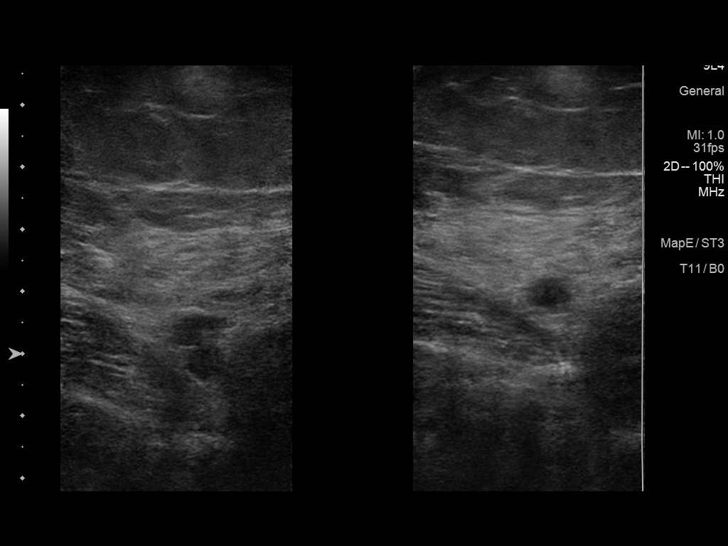
[im 10/36]
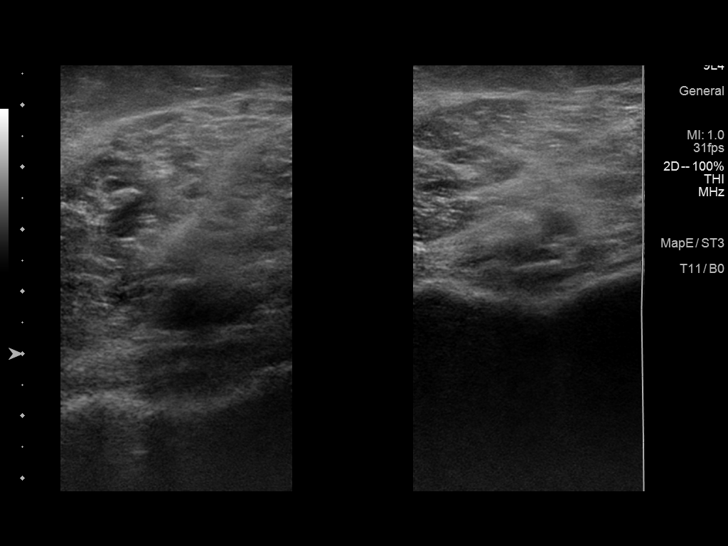
[im 13/36]
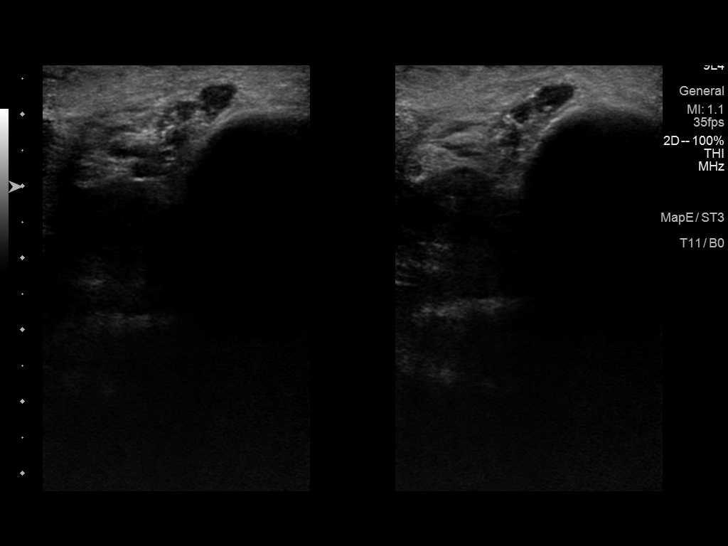
[im 16/36]
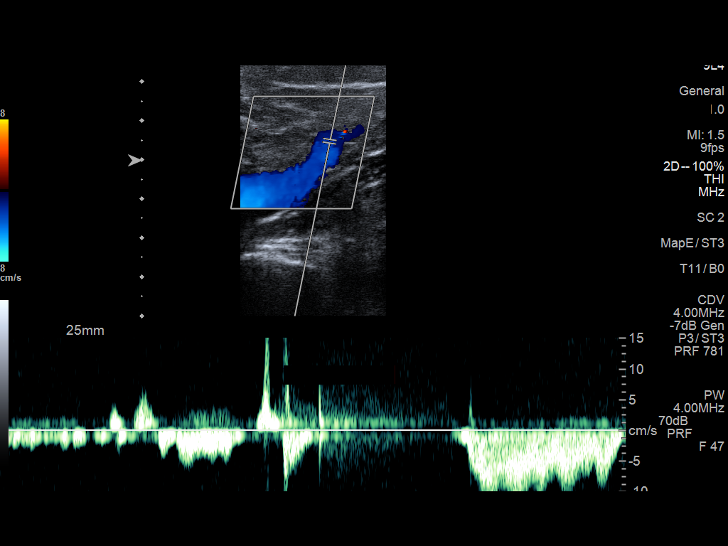
[im 19/36]
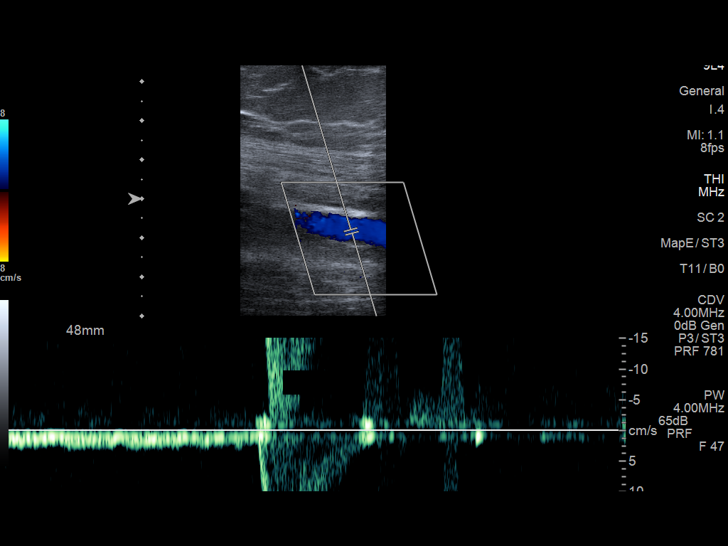
[im 20/36]
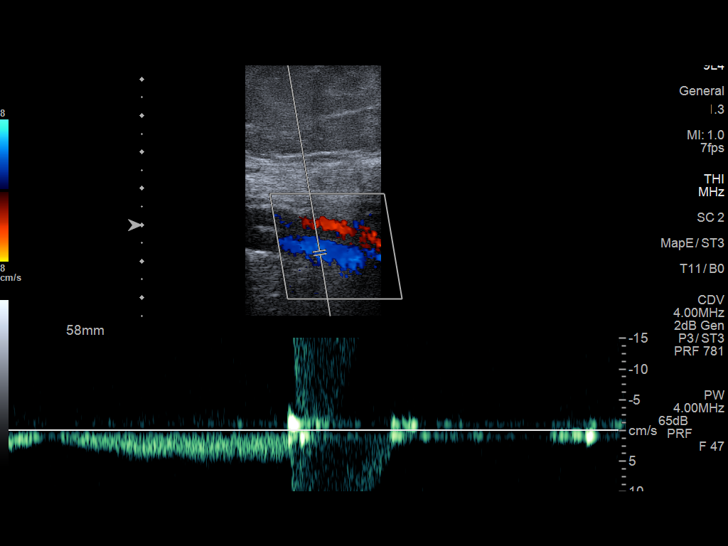
[im 23/36]
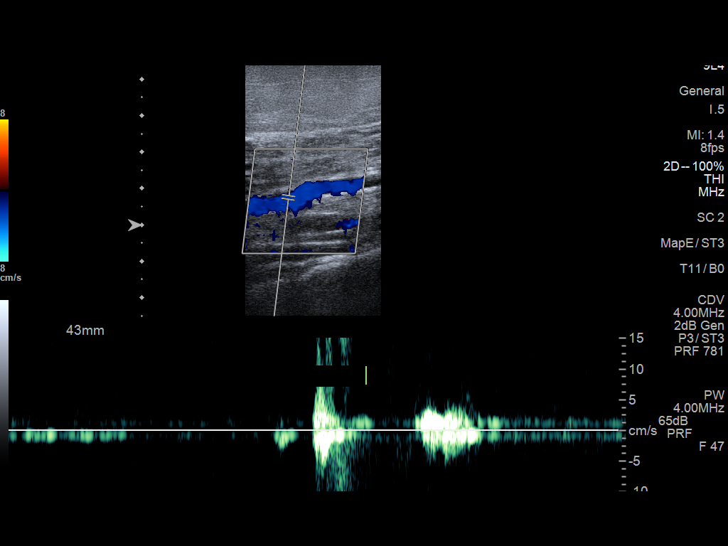
[im 26/36]
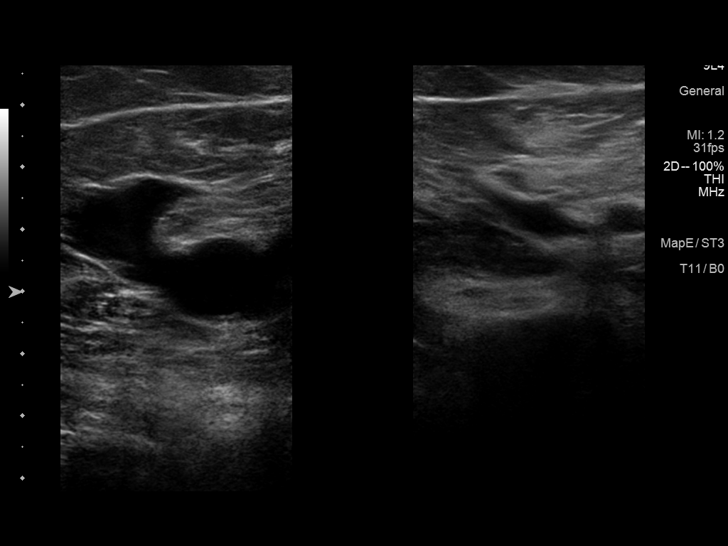
[im 29/36]
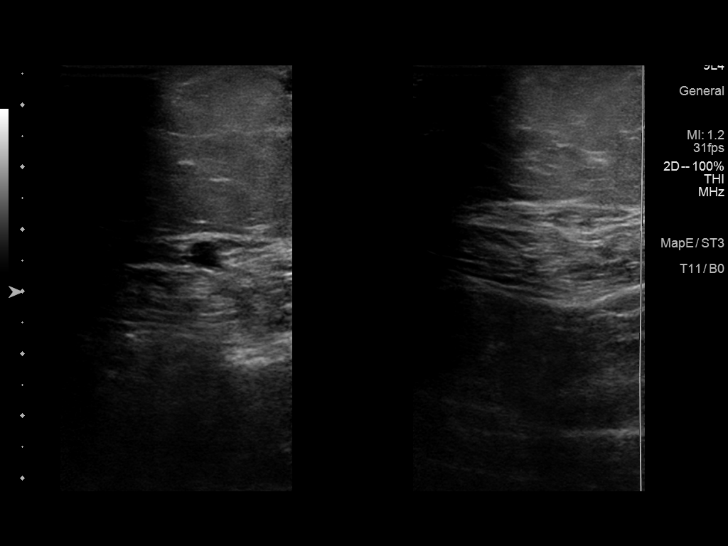
[im 32/36]
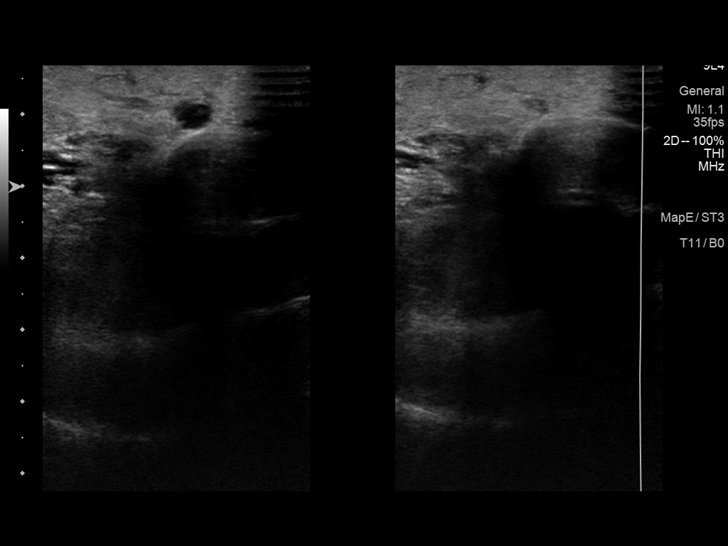
[im 36/36]
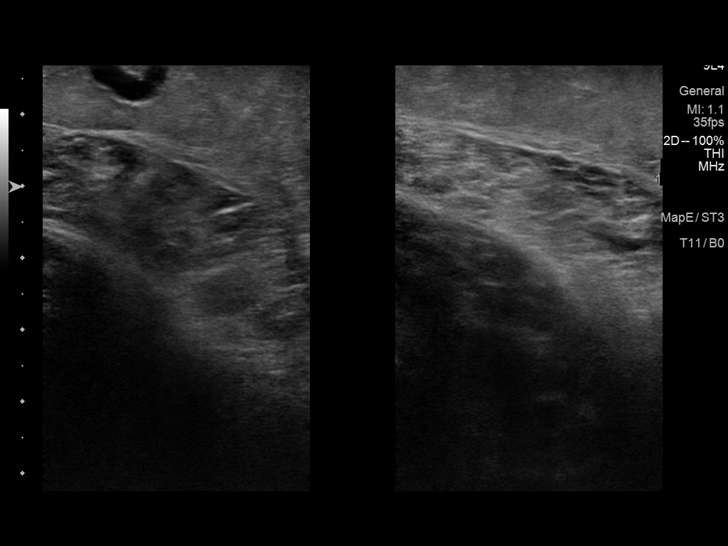

[13 of 24 positions shown; findings below may reference images not displayed]

FINDINGS: Contralateral Common Femoral Vein: Respiratory phasicity is normal
and symmetric with the symptomatic side. No evidence of thrombus.
Normal compressibility.

Common Femoral Vein: No evidence of thrombus. Normal
compressibility, respiratory phasicity and response to augmentation.

Saphenofemoral Junction: No evidence of thrombus. Normal
compressibility and flow on color Doppler imaging.

Profunda Femoral Vein: No evidence of thrombus. Normal
compressibility and flow on color Doppler imaging.

Femoral Vein: No evidence of thrombus. Normal compressibility,
respiratory phasicity and response to augmentation.

Popliteal Vein: No evidence of thrombus. Normal compressibility,
respiratory phasicity and response to augmentation.

Calf Veins: No evidence of thrombus. Normal compressibility and flow
on color Doppler imaging.

Superficial Great Saphenous Vein: No evidence of thrombus. Normal
compressibility.

Other Findings:  None.
IMPRESSION: No evidence of deep venous thrombosis.

## 2022-12-29 DIAGNOSIS — L723 Sebaceous cyst: Secondary | ICD-10-CM | POA: Diagnosis not present

## 2022-12-29 DIAGNOSIS — Z01411 Encounter for gynecological examination (general) (routine) with abnormal findings: Secondary | ICD-10-CM | POA: Diagnosis not present

## 2022-12-29 DIAGNOSIS — Z1231 Encounter for screening mammogram for malignant neoplasm of breast: Secondary | ICD-10-CM | POA: Diagnosis not present

## 2023-02-14 DIAGNOSIS — I10 Essential (primary) hypertension: Secondary | ICD-10-CM | POA: Diagnosis not present

## 2023-02-14 DIAGNOSIS — E785 Hyperlipidemia, unspecified: Secondary | ICD-10-CM | POA: Diagnosis not present

## 2023-02-14 DIAGNOSIS — R0981 Nasal congestion: Secondary | ICD-10-CM | POA: Diagnosis not present

## 2023-02-14 DIAGNOSIS — R7303 Prediabetes: Secondary | ICD-10-CM | POA: Diagnosis not present

## 2023-03-18 DIAGNOSIS — I10 Essential (primary) hypertension: Secondary | ICD-10-CM | POA: Diagnosis not present

## 2023-03-18 DIAGNOSIS — R7303 Prediabetes: Secondary | ICD-10-CM | POA: Diagnosis not present

## 2023-03-18 DIAGNOSIS — R0789 Other chest pain: Secondary | ICD-10-CM | POA: Diagnosis not present

## 2023-03-22 DIAGNOSIS — H5213 Myopia, bilateral: Secondary | ICD-10-CM | POA: Diagnosis not present

## 2023-03-22 DIAGNOSIS — H1789 Other corneal scars and opacities: Secondary | ICD-10-CM | POA: Diagnosis not present

## 2023-04-21 DIAGNOSIS — R070 Pain in throat: Secondary | ICD-10-CM | POA: Diagnosis not present

## 2023-04-21 DIAGNOSIS — J069 Acute upper respiratory infection, unspecified: Secondary | ICD-10-CM | POA: Diagnosis not present

## 2023-04-21 DIAGNOSIS — R051 Acute cough: Secondary | ICD-10-CM | POA: Diagnosis not present

## 2023-04-21 DIAGNOSIS — R0981 Nasal congestion: Secondary | ICD-10-CM | POA: Diagnosis not present

## 2023-04-28 DIAGNOSIS — J069 Acute upper respiratory infection, unspecified: Secondary | ICD-10-CM | POA: Diagnosis not present

## 2023-06-14 DIAGNOSIS — D170 Benign lipomatous neoplasm of skin and subcutaneous tissue of head, face and neck: Secondary | ICD-10-CM | POA: Diagnosis not present

## 2023-06-14 DIAGNOSIS — D229 Melanocytic nevi, unspecified: Secondary | ICD-10-CM | POA: Diagnosis not present

## 2023-06-14 DIAGNOSIS — L218 Other seborrheic dermatitis: Secondary | ICD-10-CM | POA: Diagnosis not present

## 2023-06-14 DIAGNOSIS — L720 Epidermal cyst: Secondary | ICD-10-CM | POA: Diagnosis not present

## 2023-06-24 DIAGNOSIS — R7303 Prediabetes: Secondary | ICD-10-CM | POA: Diagnosis not present

## 2023-06-24 DIAGNOSIS — E785 Hyperlipidemia, unspecified: Secondary | ICD-10-CM | POA: Diagnosis not present

## 2023-06-24 DIAGNOSIS — Z0000 Encounter for general adult medical examination without abnormal findings: Secondary | ICD-10-CM | POA: Diagnosis not present

## 2023-11-15 DIAGNOSIS — R0789 Other chest pain: Secondary | ICD-10-CM | POA: Diagnosis not present

## 2023-11-15 DIAGNOSIS — R079 Chest pain, unspecified: Secondary | ICD-10-CM | POA: Diagnosis not present

## 2023-11-15 DIAGNOSIS — I10 Essential (primary) hypertension: Secondary | ICD-10-CM | POA: Diagnosis not present

## 2023-11-15 DIAGNOSIS — I498 Other specified cardiac arrhythmias: Secondary | ICD-10-CM | POA: Diagnosis not present

## 2023-11-15 DIAGNOSIS — E785 Hyperlipidemia, unspecified: Secondary | ICD-10-CM | POA: Diagnosis not present

## 2023-11-15 DIAGNOSIS — R9431 Abnormal electrocardiogram [ECG] [EKG]: Secondary | ICD-10-CM | POA: Diagnosis not present

## 2023-11-15 DIAGNOSIS — E669 Obesity, unspecified: Secondary | ICD-10-CM | POA: Diagnosis not present

## 2023-11-15 DIAGNOSIS — Z6835 Body mass index (BMI) 35.0-35.9, adult: Secondary | ICD-10-CM | POA: Diagnosis not present
